# Patient Record
Sex: Male | Born: 1970 | Race: Black or African American | Hispanic: No | Marital: Married | Smoking: Never smoker
Health system: Southern US, Community
[De-identification: ages and names within clinical notes are randomized; demographics above are authoritative.]

## PROBLEM LIST (undated history)

## (undated) DIAGNOSIS — K5792 Diverticulitis of intestine, part unspecified, without perforation or abscess without bleeding: Secondary | ICD-10-CM

## (undated) DIAGNOSIS — I10 Essential (primary) hypertension: Secondary | ICD-10-CM

## (undated) DIAGNOSIS — K859 Acute pancreatitis without necrosis or infection, unspecified: Secondary | ICD-10-CM

## (undated) HISTORY — PX: OTHER SURGICAL HISTORY: SHX169

## (undated) HISTORY — PX: CHOLECYSTECTOMY: SHX55

---

## 2003-08-18 ENCOUNTER — Ambulatory Visit (HOSPITAL_COMMUNITY): Admission: RE | Admit: 2003-08-18 | Discharge: 2003-08-18 | Payer: Self-pay | Admitting: Gastroenterology

## 2003-12-19 ENCOUNTER — Ambulatory Visit (HOSPITAL_COMMUNITY): Admission: RE | Admit: 2003-12-19 | Discharge: 2003-12-19 | Payer: Self-pay | Admitting: Family Medicine

## 2010-04-24 ENCOUNTER — Emergency Department (HOSPITAL_BASED_OUTPATIENT_CLINIC_OR_DEPARTMENT_OTHER): Admission: EM | Admit: 2010-04-24 | Discharge: 2010-04-24 | Payer: Self-pay | Admitting: Emergency Medicine

## 2010-04-25 ENCOUNTER — Inpatient Hospital Stay (HOSPITAL_COMMUNITY): Admission: EM | Admit: 2010-04-25 | Discharge: 2010-04-30 | Payer: Self-pay | Admitting: Emergency Medicine

## 2010-04-28 ENCOUNTER — Encounter (INDEPENDENT_AMBULATORY_CARE_PROVIDER_SITE_OTHER): Payer: Self-pay | Admitting: Internal Medicine

## 2010-05-22 ENCOUNTER — Encounter: Admission: RE | Admit: 2010-05-22 | Discharge: 2010-05-22 | Payer: Self-pay | Admitting: Gastroenterology

## 2011-03-18 LAB — LIPASE, BLOOD
Lipase: 31 U/L (ref 11–59)
Lipase: 32 U/L (ref 11–59)
Lipase: 178 U/L — ABNORMAL HIGH (ref 11–59)
Lipase: 53 U/L (ref 11–59)
Lipase: >10000 U/L — ABNORMAL HIGH (ref 23–300)

## 2011-03-18 LAB — COMPREHENSIVE METABOLIC PANEL
ALT: 109 U/L — ABNORMAL HIGH (ref 0–53)
ALT: 125 U/L — ABNORMAL HIGH (ref 0–53)
ALT: 126 U/L — ABNORMAL HIGH (ref 0–53)
ALT: 183 U/L — ABNORMAL HIGH (ref 0–53)
ALT: 528 U/L — ABNORMAL HIGH (ref 0–53)
ALT: 663 U/L — ABNORMAL HIGH (ref 0–53)
AST: 118 U/L — ABNORMAL HIGH (ref 0–37)
AST: 35 U/L (ref 0–37)
AST: 56 U/L — ABNORMAL HIGH (ref 0–37)
AST: 216 U/L — ABNORMAL HIGH (ref 0–37)
AST: 41 U/L — ABNORMAL HIGH (ref 0–37)
AST: 674 U/L — ABNORMAL HIGH (ref 0–37)
Albumin: 3.1 g/dL — ABNORMAL LOW (ref 3.5–5.2)
Albumin: 3.2 g/dL — ABNORMAL LOW (ref 3.5–5.2)
Albumin: 3.5 g/dL (ref 3.5–5.2)
Albumin: 4.2 g/dL (ref 3.5–5.2)
Albumin: 4.2 g/dL (ref 3.5–5.2)
Alkaline Phosphatase: 107 U/L (ref 39–117)
Alkaline Phosphatase: 52 U/L (ref 39–117)
Alkaline Phosphatase: 64 U/L (ref 39–117)
Alkaline Phosphatase: 100 U/L (ref 39–117)
Alkaline Phosphatase: 74 U/L (ref 39–117)
Alkaline Phosphatase: 99 U/L (ref 39–117)
BUN: 4 mg/dL — ABNORMAL LOW (ref 6–23)
BUN: 6 mg/dL (ref 6–23)
BUN: 7 mg/dL (ref 6–23)
BUN: 10 mg/dL (ref 6–23)
BUN: 10 mg/dL (ref 6–23)
BUN: 14 mg/dL (ref 6–23)
CO2: 24 mEq/L (ref 19–32)
CO2: 26 mEq/L (ref 19–32)
CO2: 28 mEq/L (ref 19–32)
CO2: 29 mEq/L (ref 19–32)
CO2: 29 mEq/L (ref 19–32)
CO2: 29 mEq/L (ref 19–32)
CO2: 30 mEq/L (ref 19–32)
Calcium: 8.6 mg/dL (ref 8.4–10.5)
Calcium: 8.7 mg/dL (ref 8.4–10.5)
Calcium: 8.8 mg/dL (ref 8.4–10.5)
Calcium: 8.5 mg/dL (ref 8.4–10.5)
Calcium: 9.2 mg/dL (ref 8.4–10.5)
Calcium: 9.6 mg/dL (ref 8.4–10.5)
Chloride: 103 mEq/L (ref 96–112)
Chloride: 103 mEq/L (ref 96–112)
Chloride: 104 mEq/L (ref 96–112)
Chloride: 102 mEq/L (ref 96–112)
Chloride: 103 mEq/L (ref 96–112)
Creatinine, Ser: 0.92 mg/dL (ref 0.4–1.5)
Creatinine, Ser: 0.96 mg/dL (ref 0.4–1.5)
Creatinine, Ser: 0.98 mg/dL (ref 0.4–1.5)
Creatinine, Ser: 1.14 mg/dL (ref 0.4–1.5)
Creatinine, Ser: 1.2 mg/dL (ref 0.4–1.5)
Creatinine, Ser: 1.25 mg/dL (ref 0.4–1.5)
GFR calc Af Amer: >60 mL/min (ref >60)
GFR calc Af Amer: >60 mL/min (ref >60)
GFR calc Af Amer: >60 mL/min (ref >60)
GFR calc Af Amer: >60 mL/min (ref >60)
GFR calc Af Amer: >60 mL/min (ref >60)
GFR calc Af Amer: >60 mL/min (ref >60)
GFR calc non Af Amer: >60 mL/min (ref >60)
GFR calc non Af Amer: >60 mL/min (ref >60)
GFR calc non Af Amer: >60 mL/min (ref >60)
GFR calc non Af Amer: >60 mL/min (ref >60)
GFR calc non Af Amer: >60 mL/min (ref >60)
GFR calc non Af Amer: >60 mL/min (ref >60)
Glucose, Bld: 83 mg/dL (ref 70–99)
Glucose, Bld: 94 mg/dL (ref 70–99)
Glucose, Bld: 96 mg/dL (ref 70–99)
Glucose, Bld: 141 mg/dL — ABNORMAL HIGH (ref 70–99)
Glucose, Bld: 146 mg/dL — ABNORMAL HIGH (ref 70–99)
Glucose, Bld: 83 mg/dL (ref 70–99)
Glucose, Bld: 90 mg/dL (ref 70–99)
Potassium: 3.4 mEq/L — ABNORMAL LOW (ref 3.5–5.1)
Potassium: 3.7 mEq/L (ref 3.5–5.1)
Potassium: 3.8 mEq/L (ref 3.5–5.1)
Potassium: 3.5 mEq/L (ref 3.5–5.1)
Potassium: 3.5 mEq/L (ref 3.5–5.1)
Potassium: 3.6 mEq/L (ref 3.5–5.1)
Sodium: 136 mEq/L (ref 135–145)
Sodium: 137 mEq/L (ref 135–145)
Sodium: 139 mEq/L (ref 135–145)
Sodium: 139 mEq/L (ref 135–145)
Sodium: 140 mEq/L (ref 135–145)
Sodium: 144 mEq/L (ref 135–145)
Total Bilirubin: 0.9 mg/dL (ref 0.3–1.2)
Total Bilirubin: 1.1 mg/dL (ref 0.3–1.2)
Total Bilirubin: 1.4 mg/dL — ABNORMAL HIGH (ref 0.3–1.2)
Total Bilirubin: 1 mg/dL (ref 0.3–1.2)
Total Bilirubin: 1 mg/dL (ref 0.3–1.2)
Total Bilirubin: 1.7 mg/dL — ABNORMAL HIGH (ref 0.3–1.2)
Total Protein: 6 g/dL (ref 6.0–8.3)
Total Protein: 6.5 g/dL (ref 6.0–8.3)
Total Protein: 6.5 g/dL (ref 6.0–8.3)
Total Protein: 6.3 g/dL (ref 6.0–8.3)
Total Protein: 7 g/dL (ref 6.0–8.3)
Total Protein: 7.5 g/dL (ref 6.0–8.3)

## 2011-03-18 LAB — DIFFERENTIAL
Basophils Absolute: 0 10*3/uL (ref 0.0–0.1)
Basophils Absolute: 0 10*3/uL (ref 0.0–0.1)
Basophils Relative: 0 % (ref 0–1)
Basophils Relative: 0 % (ref 0–1)
Eosinophils Absolute: 0 10*3/uL (ref 0.0–0.7)
Eosinophils Absolute: 0 10*3/uL (ref 0.0–0.7)
Eosinophils Relative: 0 % (ref 0–5)
Eosinophils Relative: 0 % (ref 0–5)
Lymphocytes Relative: 10 % — ABNORMAL LOW (ref 12–46)
Lymphocytes Relative: 4 % — ABNORMAL LOW (ref 12–46)
Lymphs Abs: 0.7 10*3/uL (ref 0.7–4.0)
Lymphs Abs: 1.8 10*3/uL (ref 0.7–4.0)
Monocytes Absolute: 0.8 10*3/uL (ref 0.1–1.0)
Monocytes Absolute: 0.9 10*3/uL (ref 0.1–1.0)
Monocytes Relative: 4 % (ref 3–12)
Monocytes Relative: 5 % (ref 3–12)
Neutro Abs: 15.2 10*3/uL — ABNORMAL HIGH (ref 1.7–7.7)
Neutro Abs: 15.9 10*3/uL — ABNORMAL HIGH (ref 1.7–7.7)
Neutrophils Relative %: 85 % — ABNORMAL HIGH (ref 43–77)
Neutrophils Relative %: 91 % — ABNORMAL HIGH (ref 43–77)
Smear Review: NORMAL

## 2011-03-18 LAB — CBC
HCT: 37.4 % — ABNORMAL LOW (ref 39.0–52.0)
HCT: 38.7 % — ABNORMAL LOW (ref 39.0–52.0)
HCT: 38.7 % — ABNORMAL LOW (ref 39.0–52.0)
HCT: 41.7 % (ref 39.0–52.0)
HCT: 46.2 % (ref 39.0–52.0)
HCT: 48.2 % (ref 39.0–52.0)
Hemoglobin: 13 g/dL (ref 13.0–17.0)
Hemoglobin: 13.2 g/dL (ref 13.0–17.0)
Hemoglobin: 13.4 g/dL (ref 13.0–17.0)
Hemoglobin: 13.7 g/dL (ref 13.0–17.0)
Hemoglobin: 14.5 g/dL (ref 13.0–17.0)
Hemoglobin: 15.7 g/dL (ref 13.0–17.0)
Hemoglobin: 16.6 g/dL (ref 13.0–17.0)
MCHC: 34.2 g/dL (ref 30.0–36.0)
MCHC: 34.6 g/dL (ref 30.0–36.0)
MCHC: 34.8 g/dL (ref 30.0–36.0)
MCHC: 34.1 g/dL (ref 30.0–36.0)
MCHC: 34.5 g/dL (ref 30.0–36.0)
MCHC: 34.6 g/dL (ref 30.0–36.0)
MCV: 89.4 fL (ref 78.0–100.0)
MCV: 89.5 fL (ref 78.0–100.0)
MCV: 89.9 fL (ref 78.0–100.0)
MCV: 88 fL (ref 78.0–100.0)
MCV: 89.1 fL (ref 78.0–100.0)
MCV: 89.6 fL (ref 78.0–100.0)
Platelets: 144 10*3/uL — ABNORMAL LOW (ref 150–400)
Platelets: 152 10*3/uL (ref 150–400)
Platelets: 176 10*3/uL (ref 150–400)
Platelets: 164 10*3/uL (ref 150–400)
Platelets: 184 10*3/uL (ref 150–400)
RBC: 4.18 MIL/uL — ABNORMAL LOW (ref 4.22–5.81)
RBC: 4.31 MIL/uL (ref 4.22–5.81)
RBC: 4.32 MIL/uL (ref 4.22–5.81)
RBC: 4.44 MIL/uL (ref 4.22–5.81)
RBC: 4.66 MIL/uL (ref 4.22–5.81)
RBC: 5.18 MIL/uL (ref 4.22–5.81)
RBC: 5.47 MIL/uL (ref 4.22–5.81)
RDW: 13.5 % (ref 11.5–15.5)
RDW: 13.7 % (ref 11.5–15.5)
RDW: 13.7 % (ref 11.5–15.5)
RDW: 13.1 % (ref 11.5–15.5)
RDW: 13.9 % (ref 11.5–15.5)
WBC: 11.7 10*3/uL — ABNORMAL HIGH (ref 4.0–10.5)
WBC: 12.2 10*3/uL — ABNORMAL HIGH (ref 4.0–10.5)
WBC: 15.1 10*3/uL — ABNORMAL HIGH (ref 4.0–10.5)
WBC: 17.4 10*3/uL — ABNORMAL HIGH (ref 4.0–10.5)
WBC: 17.9 10*3/uL — ABNORMAL HIGH (ref 4.0–10.5)

## 2011-03-18 LAB — CMV ANTIBODY, IGG (EIA): CMV Ab - IgG: <0.2 IU/mL (ref <0.4)

## 2011-03-18 LAB — ACETAMINOPHEN LEVEL: Acetaminophen (Tylenol), Serum: <10 ug/mL — ABNORMAL LOW (ref 10–30)

## 2011-03-18 LAB — URINALYSIS, ROUTINE W REFLEX MICROSCOPIC
Bilirubin Urine: NEGATIVE
Glucose, UA: NEGATIVE mg/dL
Hgb urine dipstick: NEGATIVE
Ketones, ur: NEGATIVE mg/dL
Nitrite: NEGATIVE
Protein, ur: NEGATIVE mg/dL
Specific Gravity, Urine: 1.011 (ref 1.005–1.030)
Urobilinogen, UA: 2 mg/dL — ABNORMAL HIGH (ref 0.0–1.0)
pH: 7.5 (ref 5.0–8.0)

## 2011-03-18 LAB — LIPID PANEL
Cholesterol: 165 mg/dL (ref 0–200)
LDL Cholesterol: 103 mg/dL — ABNORMAL HIGH (ref 0–99)
Total CHOL/HDL Ratio: 3.2 RATIO

## 2011-03-18 LAB — HSV(HERPES SMPLX)ABS-I+II(IGG+IGM)-BLD
Herpes Simplex Vrs I + II Ab, IgG: 0.6 IV
Herpes Simplex Vrs I&II-IgM Ab (EIA): 0.24 INDEX

## 2011-03-18 LAB — ETHANOL
Alcohol, Ethyl (B): <5 mg/dL (ref 0–10)
Alcohol, Ethyl (B): <5 mg/dL (ref 0–10)

## 2011-03-18 LAB — LACTATE DEHYDROGENASE
LDH: 224 U/L (ref 94–250)
LDH: 354 U/L — ABNORMAL HIGH (ref 94–250)

## 2011-03-18 LAB — HEPATITIS PANEL, ACUTE: Hep B C IgM: NEGATIVE

## 2011-03-18 LAB — CMV IGM: CMV IgM: <8 AU/mL (ref <30.0)

## 2011-03-18 LAB — B. BURGDORFI ANTIBODIES: B burgdorferi Ab IgG+IgM: 0.18 {ISR}

## 2011-05-16 NOTE — Op Note (Signed)
   NAME:  Brandon Bryant, Brandon Bryant                        ACCOUNT NO.:  [id number]   MEDICAL RECORD NO.:  [id number]                   PATIENT TYPE:  AMB   LOCATION:  ENDO                                 FACILITY:  [facility]   PHYSICIAN:  Danise Edge, M.D.                DATE OF BIRTH:  24-Dec-1971   DATE OF PROCEDURE:  08/18/2003  DATE OF DISCHARGE:                                 OPERATIVE REPORT   PROCEDURE:  Colonoscopy.   PROCEDURE INDICATION:  [name] is a 40 year old male, undergoing  diagnostic colonoscopy to evaluate intermittent painless hematochezia.   ENDOSCOPIST:  Charolett Bumpers, M.D.   PREMEDICATION:  1. Versed 10 mg.  2. Demerol 100 mg.   DESCRIPTION OF PROCEDURE:  After obtaining informed consent, [name] was  placed in the left lateral decubitus position.  I administered intravenous  Demerol and intravenous Versed to achieve conscious sedation for the  procedure.  The patient's blood pressure, oxygen saturation, and cardiac  rhythm were monitored throughout the procedure and documented in the medical  record.   Anal inspection was normal.  Digital rectal examination was normal.  The  Olympus adjustable pediatric colonoscope was introduced into the rectum and  advanced to the cecum.  A normal-appearing ileocecal valve was intubated and  the distal ileum inspected.  Colonic preparation for the exam today was  excellent.   RECTUM:  Normal.  SIGMOID COLON AND DESCENDING COLON:  Normal.  SPLENIC FLEXURE:  Normal.  TRANSVERSE COLON:  Normal.  HEPATIC FLEXURE:  Normal.  ASCENDING COLON:  Normal.  CECUM AND ILEOCECAL VALVE:  Normal.  DISTAL ILEUM:  Normal.   ASSESSMENT:  Normal proctocolonoscopy to the cecum with intubation of a  normal-appearing ileocecal valve and distal ileum inspection.                                               Danise Edge, M.D.    MJ/MEDQ  D:  08/18/2003  T:  08/19/2003  Job:  [id number]

## 2013-11-08 ENCOUNTER — Emergency Department (HOSPITAL_BASED_OUTPATIENT_CLINIC_OR_DEPARTMENT_OTHER)
Admission: EM | Admit: 2013-11-08 | Discharge: 2013-11-08 | Disposition: A | Discharge status: Home / Self Care | Payer: BC Managed Care – PPO | Attending: Emergency Medicine | Admitting: Emergency Medicine

## 2013-11-08 ENCOUNTER — Encounter (HOSPITAL_BASED_OUTPATIENT_CLINIC_OR_DEPARTMENT_OTHER): Payer: Self-pay | Admitting: Emergency Medicine

## 2013-11-08 DIAGNOSIS — I10 Essential (primary) hypertension: Secondary | ICD-10-CM | POA: Insufficient documentation

## 2013-11-08 DIAGNOSIS — Z8719 Personal history of other diseases of the digestive system: Secondary | ICD-10-CM | POA: Insufficient documentation

## 2013-11-08 DIAGNOSIS — L03011 Cellulitis of right finger: Secondary | ICD-10-CM

## 2013-11-08 DIAGNOSIS — L03019 Cellulitis of unspecified finger: Secondary | ICD-10-CM | POA: Insufficient documentation

## 2013-11-08 DIAGNOSIS — Z79899 Other long term (current) drug therapy: Secondary | ICD-10-CM | POA: Insufficient documentation

## 2013-11-08 HISTORY — DX: Acute pancreatitis without necrosis or infection, unspecified: K85.90

## 2013-11-08 HISTORY — DX: Essential (primary) hypertension: I10

## 2013-11-08 MED ORDER — SULFAMETHOXAZOLE-TRIMETHOPRIM 800-160 MG PO TABS: 1.0000 | ORAL_TABLET | Freq: Two times a day (BID) | ORAL | Status: AC

## 2013-11-08 NOTE — ED Notes (Signed)
PA at bedside.

## 2013-11-08 NOTE — ED Provider Notes (Signed)
CSN: [id number]     Arrival date & time 11/08/13  1853 History   First MD Initiated Contact with Patient 11/08/13 2022     Chief Complaint  Patient presents with  . Abscess   (Consider location/radiation/quality/duration/timing/severity/associated sxs/prior Treatment) Patient is a 42 y.o. male presenting with abscess. The history is provided by the patient. No language interpreter was used.  Abscess Location:  Finger Abscess quality: draining   Red streaking: no   Progression:  Worsening Chronicity:  New Context: not diabetes   Relieved by:  Nothing Worsened by:  Nothing tried Ineffective treatments:  None tried Pt complains of swelling around nail  Past Medical History  Diagnosis Date  . Hypertension   . Pancreatitis    Past Surgical History  Procedure Laterality Date  . Cholecystectomy    . Kneee surgery     No family history on file. History  Substance Use Topics  . Smoking status: Never Smoker   . Smokeless tobacco: Not on file  . Alcohol Use: No    Review of Systems  Skin: Positive for wound.  All other systems reviewed and are negative.    Allergies  Review of patient's allergies indicates no known allergies.  Home Medications   Current Outpatient Rx  Name  Route  Sig  Dispense  Refill  . hydrochlorothiazide (HYDRODIURIL) 25 MG tablet   Oral   Take 25 mg by mouth daily.         Marland Kitchen sulfamethoxazole-trimethoprim (BACTRIM DS,SEPTRA DS) 800-160 MG per tablet   Oral   Take 1 tablet by mouth 2 (two) times daily.   14 tablet   0    BP 154/93  Pulse 88  Temp(Src) 98.6 F (37 C) (Oral)  Resp 18  Ht 6' (1.829 m)  Wt 335 lb (151.955 kg)  BMI 45.42 kg/m2  SpO2 100% Physical Exam  Nursing note and vitals reviewed. Constitutional: He is oriented to person, place, and time. He appears well-developed and well-nourished.  HENT:  Head: Normocephalic.  Musculoskeletal: He exhibits tenderness.  Swollen green fluctuant area around nailbed   Neurological: He is alert and oriented to person, place, and time. He has normal reflexes.  Skin: Skin is warm.  Psychiatric: He has a normal mood and affect.    ED Course  INCISION AND DRAINAGE Date/Time: 11/08/2013 8:47 PM Performed by: Elson Areas Authorized by: Elson Areas Consent given by: patient Patient understanding: patient states understanding of the procedure being performed Patient identity confirmed: verbally with patient Time out: Immediately prior to procedure a "time out" was called to verify the correct patient, procedure, equipment, support staff and site/side marked as required. Type: abscess Body area: upper extremity Location details: right ring finger Anesthesia: local infiltration Scalpel size: 11 Incision type: single straight Complexity: simple Drainage amount: moderate Wound treatment: wound left open Patient tolerance: Patient tolerated the procedure well with no immediate complications.   (including critical care time) Labs Review Labs Reviewed - No data to display Imaging Review No results found.  EKG Interpretation   None       MDM   1. Paronychia, finger, right    Soak area and bactrim   Elson Areas, PA-C 11/08/13 2048

## 2013-11-08 NOTE — ED Provider Notes (Signed)
Medical screening examination/treatment/procedure(s) were performed by non-physician practitioner and as supervising physician I was immediately available for consultation/collaboration.    Celene Kras, MD 11/08/13 [phone]

## 2013-11-08 NOTE — ED Notes (Signed)
Pt c/o abscess around the nail bed of his right 4th finger x3-4 days.

## 2014-01-22 ENCOUNTER — Emergency Department (HOSPITAL_BASED_OUTPATIENT_CLINIC_OR_DEPARTMENT_OTHER)
Admission: EM | Admit: 2014-01-22 | Discharge: 2014-01-22 | Disposition: A | Discharge status: Home / Self Care | Payer: BC Managed Care – PPO | Attending: Emergency Medicine | Admitting: Emergency Medicine

## 2014-01-22 ENCOUNTER — Encounter (HOSPITAL_BASED_OUTPATIENT_CLINIC_OR_DEPARTMENT_OTHER): Payer: Self-pay | Admitting: Emergency Medicine

## 2014-01-22 DIAGNOSIS — K5792 Diverticulitis of intestine, part unspecified, without perforation or abscess without bleeding: Secondary | ICD-10-CM

## 2014-01-22 DIAGNOSIS — K5732 Diverticulitis of large intestine without perforation or abscess without bleeding: Secondary | ICD-10-CM | POA: Insufficient documentation

## 2014-01-22 DIAGNOSIS — Z79899 Other long term (current) drug therapy: Secondary | ICD-10-CM | POA: Insufficient documentation

## 2014-01-22 DIAGNOSIS — I10 Essential (primary) hypertension: Secondary | ICD-10-CM | POA: Insufficient documentation

## 2014-01-22 MED ORDER — METRONIDAZOLE 500 MG PO TABS
500.0000 mg | ORAL_TABLET | Freq: Once | ORAL | Status: AC
  Administered 2014-01-22: 500 mg via ORAL
  Filled 2014-01-22: qty 1

## 2014-01-22 MED ORDER — OXYCODONE-ACETAMINOPHEN 5-325 MG PO TABS
2.0000 | ORAL_TABLET | Freq: Once | ORAL | Status: AC
  Administered 2014-01-22: 2 via ORAL
  Filled 2014-01-22: qty 2

## 2014-01-22 MED ORDER — OXYCODONE-ACETAMINOPHEN 5-325 MG PO TABS: 1.0000 | ORAL_TABLET | Freq: Four times a day (QID) | ORAL | Status: DC | PRN

## 2014-01-22 MED ORDER — CIPROFLOXACIN HCL 500 MG PO TABS
500.0000 mg | ORAL_TABLET | Freq: Once | ORAL | Status: AC
  Administered 2014-01-22: 500 mg via ORAL
  Filled 2014-01-22: qty 1

## 2014-01-22 MED ORDER — CIPROFLOXACIN HCL 500 MG PO TABS: 500.0000 mg | ORAL_TABLET | Freq: Two times a day (BID) | ORAL | Status: DC

## 2014-01-22 MED ORDER — METRONIDAZOLE 500 MG PO TABS: 500.0000 mg | ORAL_TABLET | Freq: Three times a day (TID) | ORAL | Status: DC

## 2014-01-22 NOTE — ED Notes (Signed)
Patient having LLQ pain that started Friday. Denies any other symptoms. History of diverticulitis and worried this may be coming back. LBM this AM

## 2014-01-22 NOTE — Discharge Instructions (Signed)
Diverticulitis °A diverticulum is a small pouch or sac on the colon. Diverticulosis is the presence of these diverticula on the colon. Diverticulitis is the irritation (inflammation) or infection of diverticula. °CAUSES  °The colon and its diverticula contain bacteria. If food particles block the tiny opening to a diverticulum, the bacteria inside can grow and cause an increase in pressure. This leads to infection and inflammation and is called diverticulitis. °SYMPTOMS  °· Abdominal pain and tenderness. Usually, the pain is located on the left side of your abdomen. However, it could be located elsewhere. °· Fever. °· Bloating. °· Feeling sick to your stomach (nausea). °· Throwing up (vomiting). °· Abnormal stools. °DIAGNOSIS  °Your caregiver will take a history and perform a physical exam. Since many things can cause abdominal pain, other tests may be necessary. Tests may include: °· Blood tests. °· Urine tests. °· X-ray of the abdomen. °· CT scan of the abdomen. °Sometimes, surgery is needed to determine if diverticulitis or other conditions are causing your symptoms. °TREATMENT  °Most of the time, you can be treated without surgery. Treatment includes: °· Resting the bowels by only having liquids for a few days. As you improve, you will need to eat a low-fiber diet. °· Intravenous (IV) fluids if you are losing body fluids (dehydrated). °· Antibiotic medicines that treat infections may be given. °· Pain and nausea medicine, if needed. °· Surgery if the inflamed diverticulum has burst. °HOME CARE INSTRUCTIONS  °· Try a clear liquid diet (broth, tea, or water for as long as directed by your caregiver). You may then gradually begin a low-fiber diet as tolerated.  °A low-fiber diet is a diet with less than 10 grams of fiber. Choose the foods below to reduce fiber in the diet: °· White breads, cereals, rice, and pasta. °· Cooked fruits and vegetables or soft fresh fruits and vegetables without the skin. °· Ground or  well-cooked tender beef, ham, veal, lamb, pork, or poultry. °· Eggs and seafood. °· After your diverticulitis symptoms have improved, your caregiver may put you on a high-fiber diet. A high-fiber diet includes 14 grams of fiber for every 1000 calories consumed. For a standard 2000 calorie diet, you would need 28 grams of fiber. Follow these diet guidelines to help you increase the fiber in your diet. It is important to slowly increase the amount fiber in your diet to avoid gas, constipation, and bloating. °· Choose whole-grain breads, cereals, pasta, and brown rice. °· Choose fresh fruits and vegetables with the skin on. Do not overcook vegetables because the more vegetables are cooked, the more fiber is lost. °· Choose more nuts, seeds, legumes, dried peas, beans, and lentils. °· Look for food products that have greater than 3 grams of fiber per serving on the Nutrition Facts label. °· Take all medicine as directed by your caregiver. °· If your caregiver has given you a follow-up appointment, it is very important that you go. Not going could result in lasting (chronic) or permanent injury, pain, and disability. If there is any problem keeping the appointment, call to reschedule. °SEEK MEDICAL CARE IF:  °· Your pain does not improve. °· You have a hard time advancing your diet beyond clear liquids. °· Your bowel movements do not return to normal. °SEEK IMMEDIATE MEDICAL CARE IF:  °· Your pain becomes worse. °· You have an oral temperature above 102° F (38.9° C), not controlled by medicine. °· You have repeated vomiting. °· You have bloody or black, tarry stools. °·   Symptoms that brought you to your caregiver become worse or are not getting better. °MAKE SURE YOU:  °· Understand these instructions. °· Will watch your condition. °· Will get help right away if you are not doing well or get worse. °Document Released: 09/24/2005 Document Revised: 03/08/2012 Document Reviewed: 01/20/2011 °Exit[employer]® Patient Information  ©2014 [employer]. ° °

## 2014-01-22 NOTE — ED Provider Notes (Signed)
CSN: [id number]     Arrival date & time 01/22/14  1057 History   First MD Initiated Contact with Patient 01/22/14 1134     Chief Complaint  Patient presents with  . Abdominal Pain   (Consider location/radiation/quality/duration/timing/severity/associated sxs/prior Treatment) Patient is a 43 y.o. male presenting with abdominal pain.  Abdominal Pain  Pt with history of diverticulitis in 2004 and gall-stone pancreatitis in 2011 s/o lap chole reports 2 days of moderate sharp LLQ pain worse with rolling over. Similar to previous diverticulitis, not associated with vomiting, fever or anorexia. No bloody or melanic stools.   Past Medical History  Diagnosis Date  . Hypertension   . Pancreatitis    Past Surgical History  Procedure Laterality Date  . Cholecystectomy    . Kneee surgery     No family history on file. History  Substance Use Topics  . Smoking status: Never Smoker   . Smokeless tobacco: Not on file  . Alcohol Use: No    Review of Systems  Gastrointestinal: Positive for abdominal pain.   All other systems reviewed and are negative except as noted in HPI.   Allergies  Review of patient's allergies indicates no known allergies.  Home Medications   Current Outpatient Rx  Name  Route  Sig  Dispense  Refill  . hydrochlorothiazide (HYDRODIURIL) 25 MG tablet   Oral   Take 25 mg by mouth daily.          BP 130/82  Pulse 78  Temp(Src) 98.3 F (36.8 C) (Oral)  Resp 20  Ht 6' (1.829 m)  Wt 347 lb (157.398 kg)  BMI 47.05 kg/m2  SpO2 98% Physical Exam  Nursing note and vitals reviewed. Constitutional: He is oriented to person, place, and time. He appears well-developed and well-nourished.  HENT:  Head: Normocephalic and atraumatic.  Eyes: EOM are normal. Pupils are equal, round, and reactive to light.  Neck: Normal range of motion. Neck supple.  Cardiovascular: Normal rate, normal heart sounds and intact distal pulses.   Pulmonary/Chest: Effort normal and breath  sounds normal.  Abdominal: Bowel sounds are normal. He exhibits no distension. There is tenderness (LLQ). There is guarding.  Musculoskeletal: Normal range of motion. He exhibits no edema and no tenderness.  Neurological: He is alert and oriented to person, place, and time. He has normal strength. No cranial nerve deficit or sensory deficit.  Skin: Skin is warm and dry. No rash noted.  Psychiatric: He has a normal mood and affect.    ED Course  Procedures (including critical care time) Labs Review Labs Reviewed - No data to display Imaging Review No results found.  EKG Interpretation   None       MDM   1. Diverticulitis     Pt with history and exam consistent with diverticulitis. Given history of same, lack of constitutional symptoms, minimal pain at this time, will treat empirically and advised close PCP followup for recheck. Advised to return here sooner if pain worsens, fever, vomiting or for any other concerns.     Niaya Hickok B. Bernette MayersSheldon, MD 01/22/14 1150

## 2014-06-25 ENCOUNTER — Encounter (HOSPITAL_BASED_OUTPATIENT_CLINIC_OR_DEPARTMENT_OTHER): Payer: Self-pay | Admitting: Emergency Medicine

## 2014-06-25 ENCOUNTER — Emergency Department (HOSPITAL_BASED_OUTPATIENT_CLINIC_OR_DEPARTMENT_OTHER)
Admission: EM | Admit: 2014-06-25 | Discharge: 2014-06-25 | Disposition: A | Discharge status: Home / Self Care | Payer: BC Managed Care – PPO | Attending: Emergency Medicine | Admitting: Emergency Medicine

## 2014-06-25 ENCOUNTER — Emergency Department (HOSPITAL_BASED_OUTPATIENT_CLINIC_OR_DEPARTMENT_OTHER): Payer: BC Managed Care – PPO

## 2014-06-25 DIAGNOSIS — K5732 Diverticulitis of large intestine without perforation or abscess without bleeding: Secondary | ICD-10-CM | POA: Insufficient documentation

## 2014-06-25 DIAGNOSIS — Z79899 Other long term (current) drug therapy: Secondary | ICD-10-CM | POA: Insufficient documentation

## 2014-06-25 DIAGNOSIS — I10 Essential (primary) hypertension: Secondary | ICD-10-CM | POA: Insufficient documentation

## 2014-06-25 HISTORY — DX: Diverticulitis of intestine, part unspecified, without perforation or abscess without bleeding: K57.92

## 2014-06-25 LAB — URINALYSIS, ROUTINE W REFLEX MICROSCOPIC
GLUCOSE, UA: NEGATIVE mg/dL
Hgb urine dipstick: NEGATIVE
Ketones, ur: >80 mg/dL — AB
LEUKOCYTES UA: NEGATIVE
Nitrite: NEGATIVE
PROTEIN: 100 mg/dL — AB
Specific Gravity, Urine: 1.025 (ref 1.005–1.030)
UROBILINOGEN UA: 0.2 mg/dL (ref 0.0–1.0)
pH: 5.5 (ref 5.0–8.0)

## 2014-06-25 LAB — COMPREHENSIVE METABOLIC PANEL
ALT: 32 U/L (ref 0–53)
AST: 22 U/L (ref 0–37)
Albumin: 4.6 g/dL (ref 3.5–5.2)
Alkaline Phosphatase: 79 U/L (ref 39–117)
BUN: 7 mg/dL (ref 6–23)
CALCIUM: 10.3 mg/dL (ref 8.4–10.5)
CO2: 22 meq/L (ref 19–32)
CREATININE: 1.2 mg/dL (ref 0.50–1.35)
Chloride: 96 mEq/L (ref 96–112)
GFR, EST AFRICAN AMERICAN: 84 mL/min — AB (ref >90)
GFR, EST NON AFRICAN AMERICAN: 73 mL/min — AB (ref >90)
GLUCOSE: 90 mg/dL (ref 70–99)
Potassium: 4.6 mEq/L (ref 3.7–5.3)
Sodium: 139 mEq/L (ref 137–147)
Total Bilirubin: 0.9 mg/dL (ref 0.3–1.2)
Total Protein: 8.3 g/dL (ref 6.0–8.3)

## 2014-06-25 LAB — CBC WITH DIFFERENTIAL/PLATELET
Basophils Absolute: 0.1 10*3/uL (ref 0.0–0.1)
Basophils Relative: 0 % (ref 0–1)
EOS ABS: 0 10*3/uL (ref 0.0–0.7)
EOS PCT: 0 % (ref 0–5)
HEMATOCRIT: 49.5 % (ref 39.0–52.0)
Hemoglobin: 16.8 g/dL (ref 13.0–17.0)
LYMPHS ABS: 1.2 10*3/uL (ref 0.7–4.0)
Lymphocytes Relative: 7 % — ABNORMAL LOW (ref 12–46)
MCH: 28.7 pg (ref 26.0–34.0)
MCHC: 33.9 g/dL (ref 30.0–36.0)
MCV: 84.5 fL (ref 78.0–100.0)
MONO ABS: 0.9 10*3/uL (ref 0.1–1.0)
Monocytes Relative: 5 % (ref 3–12)
Neutro Abs: 15.6 10*3/uL — ABNORMAL HIGH (ref 1.7–7.7)
Neutrophils Relative %: 88 % — ABNORMAL HIGH (ref 43–77)
PLATELETS: 180 10*3/uL (ref 150–400)
RBC: 5.86 MIL/uL — AB (ref 4.22–5.81)
RDW: 14 % (ref 11.5–15.5)
WBC: 17.7 10*3/uL — ABNORMAL HIGH (ref 4.0–10.5)

## 2014-06-25 LAB — URINE MICROSCOPIC-ADD ON

## 2014-06-25 LAB — LIPASE, BLOOD: Lipase: 24 U/L (ref 11–59)

## 2014-06-25 MED ORDER — METRONIDAZOLE IN NACL 5-0.79 MG/ML-% IV SOLN
500.0000 mg | Freq: Once | INTRAVENOUS | Status: AC
  Administered 2014-06-25: 500 mg via INTRAVENOUS
  Filled 2014-06-25: qty 100

## 2014-06-25 MED ORDER — CIPROFLOXACIN IN D5W 400 MG/200ML IV SOLN
400.0000 mg | Freq: Once | INTRAVENOUS | Status: AC
  Administered 2014-06-25: 400 mg via INTRAVENOUS
  Filled 2014-06-25: qty 200

## 2014-06-25 MED ORDER — IOHEXOL 300 MG/ML  SOLN
100.0000 mL | Freq: Once | INTRAMUSCULAR | Status: AC | PRN
  Administered 2014-06-25: 100 mL via INTRAVENOUS

## 2014-06-25 MED ORDER — IOHEXOL 300 MG/ML  SOLN
50.0000 mL | Freq: Once | INTRAMUSCULAR | Status: AC | PRN
  Administered 2014-06-25: 50 mL via ORAL

## 2014-06-25 MED ORDER — SODIUM CHLORIDE 0.9 % IV BOLUS (SEPSIS)
1000.0000 mL | Freq: Once | INTRAVENOUS | Status: AC
  Administered 2014-06-25: 1000 mL via INTRAVENOUS

## 2014-06-25 MED ORDER — OXYCODONE-ACETAMINOPHEN 5-325 MG PO TABS: 1.0000 | ORAL_TABLET | ORAL | Status: AC | PRN

## 2014-06-25 MED ORDER — HYDROMORPHONE HCL PF 1 MG/ML IJ SOLN
1.0000 mg | Freq: Once | INTRAMUSCULAR | Status: AC
  Administered 2014-06-25: 1 mg via INTRAVENOUS
  Filled 2014-06-25: qty 1

## 2014-06-25 MED ORDER — ONDANSETRON 8 MG PO TBDP: 8.0000 mg | ORAL_TABLET | Freq: Three times a day (TID) | ORAL | Status: AC | PRN

## 2014-06-25 MED ORDER — ONDANSETRON HCL 4 MG/2ML IJ SOLN
4.0000 mg | Freq: Once | INTRAMUSCULAR | Status: AC
  Administered 2014-06-25: 4 mg via INTRAVENOUS
  Filled 2014-06-25: qty 2

## 2014-06-25 MED ORDER — METRONIDAZOLE 500 MG PO TABS: 500.0000 mg | ORAL_TABLET | Freq: Two times a day (BID) | ORAL | Status: AC

## 2014-06-25 MED ORDER — CIPROFLOXACIN HCL 500 MG PO TABS: 500.0000 mg | ORAL_TABLET | Freq: Two times a day (BID) | ORAL | Status: AC

## 2014-06-25 NOTE — ED Notes (Addendum)
Patient here with lower abdominal pain since Friday that has become constant since am. Reports vomiting x 1 and no diarrhea. History of diverticulitis and thinks may be flare up, slightly pale on arrival. Started a body cleanse 16 days ago, unsure if related

## 2014-06-25 NOTE — ED Notes (Signed)
Patient transported to CT 

## 2014-06-25 NOTE — ED Provider Notes (Signed)
CSN: [id number]     Arrival date & time 06/25/14  1306 History   First MD Initiated Contact with Patient 06/25/14 1339     Chief Complaint  Patient presents with  . Abdominal Pain     (Consider location/radiation/quality/duration/timing/severity/associated sxs/prior Treatment) Patient is a 43 y.o. male presenting with abdominal pain. The history is provided by the patient.  Abdominal Pain Pain location:  RLQ and LLQ Pain quality: fullness   Pain radiates to:  LLQ and RLQ Pain severity:  Moderate Onset quality:  Gradual Duration:  2 days Timing:  Constant Progression:  Worsening Chronicity:  New Context: not alcohol use, not awakening from sleep, not diet changes, not eating, not laxative use, not medication withdrawal, not previous surgeries, not recent illness, not recent sexual activity, not recent travel, not retching, not sick contacts, not suspicious food intake and not trauma   Relieved by:  Nothing Worsened by:  Nothing tried Associated symptoms: vomiting   Risk factors: no alcohol abuse and no aspirin use     Past Medical History  Diagnosis Date  . Hypertension   . Pancreatitis   . Diverticulitis    Past Surgical History  Procedure Laterality Date  . Cholecystectomy    . Kneee surgery     No family history on file. History  Substance Use Topics  . Smoking status: Never Smoker   . Smokeless tobacco: Not on file  . Alcohol Use: No    Review of Systems  Gastrointestinal: Positive for vomiting and abdominal pain.  All other systems reviewed and are negative.     Allergies  Review of patient's allergies indicates no known allergies.  Home Medications   Prior to Admission medications   Medication Sig Start Date End Date Taking? Authorizing Provider  hydrochlorothiazide (HYDRODIURIL) 25 MG tablet Take 25 mg by mouth daily.   Yes Historical Provider, MD   BP 135/88  Pulse 97  Temp(Src) 97.9 F (36.6 C) (Oral)  Resp 20  Wt 305 lb (138.347 kg)  SpO2  98% Physical Exam  Nursing note and vitals reviewed. Constitutional: He is oriented to person, place, and time. He appears well-developed and well-nourished.  HENT:  Head: Normocephalic and atraumatic.  Eyes: Conjunctivae and EOM are normal. Pupils are equal, round, and reactive to light.  Neck: Normal range of motion. Neck supple.  Cardiovascular: Normal rate, regular rhythm, normal heart sounds and intact distal pulses.   Pulmonary/Chest: Effort normal and breath sounds normal.  Abdominal: Soft. Bowel sounds are normal. He exhibits no mass. There is tenderness. There is rebound. There is no guarding.    Musculoskeletal: Normal range of motion.  Neurological: He is alert and oriented to person, place, and time. He has normal reflexes.  Skin: Skin is warm and dry.  Psychiatric: He has a normal mood and affect. His behavior is normal. Judgment and thought content normal.    ED Course  Procedures (including critical care time) Labs Review Labs Reviewed  URINALYSIS, ROUTINE W REFLEX MICROSCOPIC - Abnormal; Notable for the following:    Color, Urine AMBER (*)    Bilirubin Urine SMALL (*)    Ketones, ur >80 (*)    Protein, ur 100 (*)    All other components within normal limits  COMPREHENSIVE METABOLIC PANEL - Abnormal; Notable for the following:    GFR calc non Af Amer 73 (*)    GFR calc Af Amer 84 (*)    All other components within normal limits  CBC WITH DIFFERENTIAL - Abnormal;  Notable for the following:    WBC 17.7 (*)    RBC 5.86 (*)    Neutrophils Relative % 88 (*)    Neutro Abs 15.6 (*)    Lymphocytes Relative 7 (*)    All other components within normal limits  URINE MICROSCOPIC-ADD ON  LIPASE, BLOOD    Imaging Review No results found.   EKG Interpretation None      MDM   Final diagnoses:  Diverticulitis of large intestine without perforation or abscess without bleeding     Patient with good pain control  Cipro and flagyl given here. Plan op treatment  with strict return precautions and close follow up with Dr. Manus GunningEhinger.  Patient voices understanding.   Hilario Quarryanielle S Ray, MD 06/26/14 [phone]

## 2014-06-25 NOTE — Discharge Instructions (Signed)
Return if you are unable to keep down medicines or liquids, start having a fever, or worsening pain. Recheck with Dr. Manus GunningEhinger this week.  The ct scan results suggest that you should follow up with a gastroenterologist for colonoscopy after this episode resolves.  Diverticulitis Diverticulitis is inflammation or infection of small pouches in your colon that form when you have a condition called diverticulosis. The pouches in your colon are called diverticula. Your colon, or large intestine, is where water is absorbed and stool is formed. Complications of diverticulitis can include:  Bleeding.  Severe infection.  Severe pain.  Perforation of your colon.  Obstruction of your colon. CAUSES  Diverticulitis is caused by bacteria. Diverticulitis happens when stool becomes trapped in diverticula. This allows bacteria to grow in the diverticula, which can lead to inflammation and infection. RISK FACTORS People with diverticulosis are at risk for diverticulitis. Eating a diet that does not include enough fiber from fruits and vegetables may make diverticulitis more likely to develop. SYMPTOMS  Symptoms of diverticulitis may include:  Abdominal pain and tenderness. The pain is normally located on the left side of the abdomen, but may occur in other areas.  Fever and chills.  Bloating.  Cramping.  Nausea.  Vomiting.  Constipation.  Diarrhea.  Blood in your stool. DIAGNOSIS  Your health care provider will ask you about your medical history and do a physical exam. You may need to have tests done because many medical conditions can cause the same symptoms as diverticulitis. Tests may include:  Blood tests.  Urine tests.  Imaging tests of the abdomen, including X-rays and CT scans. When your condition is under control, your health care provider may recommend that you have a colonoscopy. A colonoscopy can show how severe your diverticula are and whether something else is causing your  symptoms. TREATMENT  Most cases of diverticulitis are mild and can be treated at home. Treatment may include:  Taking over-the-counter pain medicines.  Following a clear liquid diet.  Taking antibiotic medicines by mouth for 7-10 days. More severe cases may be treated at a hospital. Treatment may include:  Not eating or drinking.  Taking prescription pain medicine.  Receiving antibiotic medicines through an IV tube.  Receiving fluids and nutrition through an IV tube.  Surgery. HOME CARE INSTRUCTIONS   Follow your health care provider's instructions carefully.  Follow a full liquid diet or other diet as directed by your health care provider. After your symptoms improve, your health care provider may tell you to change your diet. He or she may recommend you eat a high-fiber diet. Fruits and vegetables are good sources of fiber. Fiber makes it easier to pass stool.  Take fiber supplements or probiotics as directed by your health care provider.  Only take medicines as directed by your health care provider.  Keep all your follow-up appointments. SEEK MEDICAL CARE IF:   Your pain does not improve.  You have a hard time eating food.  Your bowel movements do not return to normal. SEEK IMMEDIATE MEDICAL CARE IF:   Your pain becomes worse.  Your symptoms do not get better.  Your symptoms suddenly get worse.  You have a fever.  You have repeated vomiting.  You have bloody or black, tarry stools. MAKE SURE YOU:   Understand these instructions.  Will watch your condition.  Will get help right away if you are not doing well or get worse. Document Released: 09/24/2005 Document Revised: 12/20/2013 Document Reviewed: 11/09/2013 Exit[employer] Patient Information 2015  [employer]. This information is not intended to replace advice given to you by your health care provider. Make sure you discuss any questions you have with your health care provider.

## 2015-09-07 IMAGING — CT CT ABD-PELV W/ CM
2 of 5 series · 16 of 46 positions shown, 18 images · IV contrast (omnipaque)
Comparison: CT 12/19/2003; MRI abdomen 05/22/2010.

CLINICAL DATA: Bilateral lower abdominal pain.

EXAM:
CT ABDOMEN AND PELVIS WITH CONTRAST
TECHNIQUE: Multidetector CT imaging of the abdomen and pelvis was performed
using the standard protocol following bolus administration of
intravenous contrast.
CONTRAST:  100mL OMNIPAQUE IOHEXOL 300 MG/ML SOLN, 50mL OMNIPAQUE
IOHEXOL 300 MG/ML SOLN

[Series 4: abd/pelvis 5.0 b31f · axial · 0.86mm/px · z∈[-503,-43]mm · 13 of 104 slices shown, 15 images]
[im 6/104  soft-tissue]
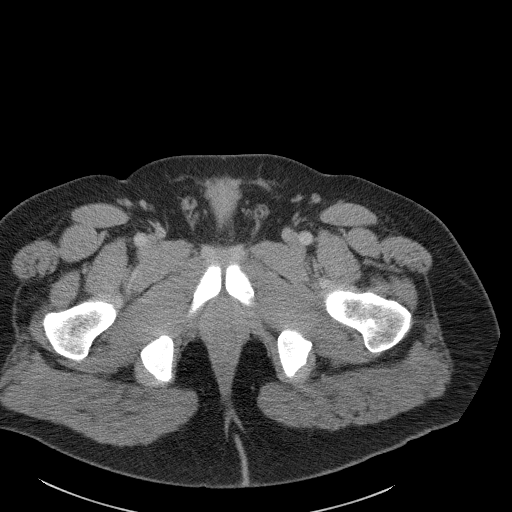
[im 6/104  bone]
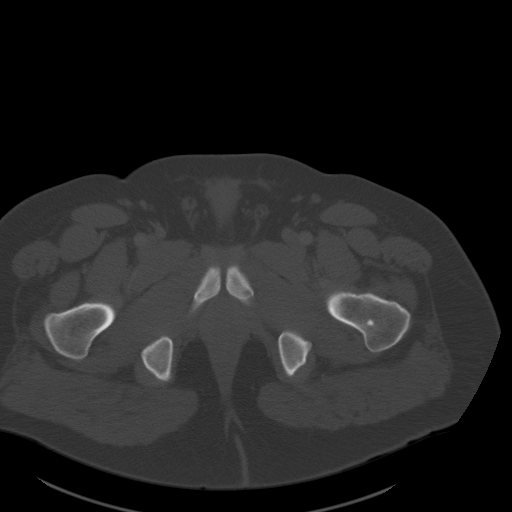
[im 16/104  soft-tissue]
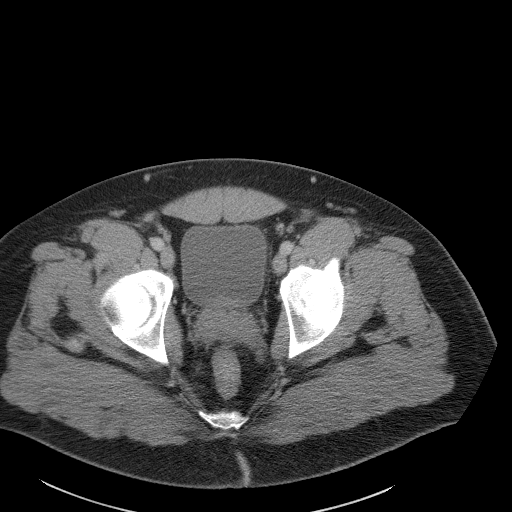
[im 21/104  soft-tissue]
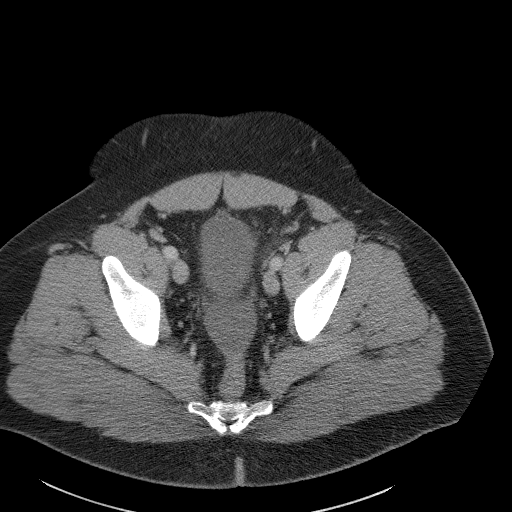
[im 31/104  soft-tissue]
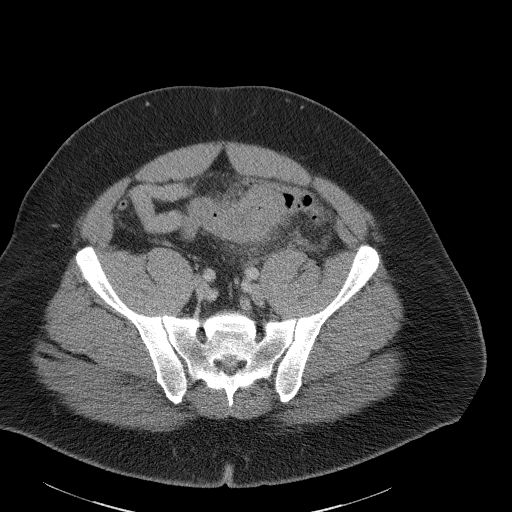
[im 37/104  soft-tissue]
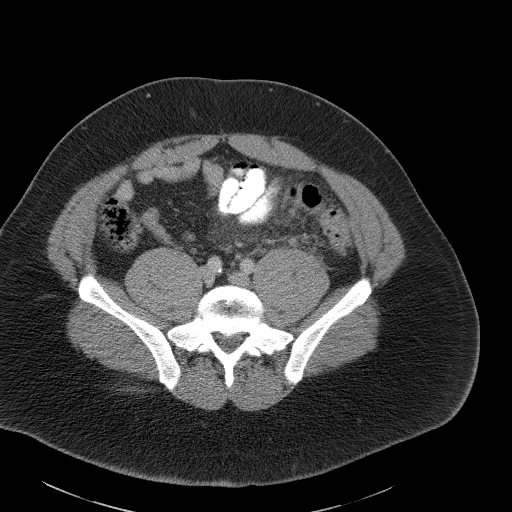
[im 47/104  soft-tissue]
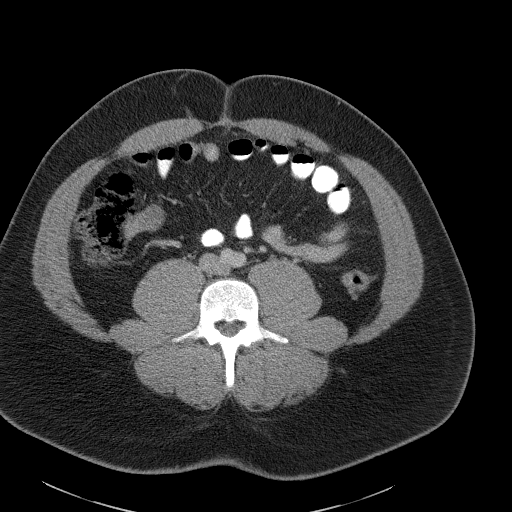
[im 52/104  soft-tissue]
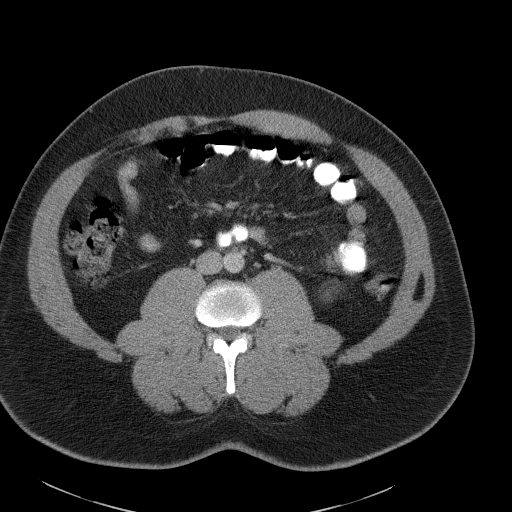
[im 57/104  soft-tissue]
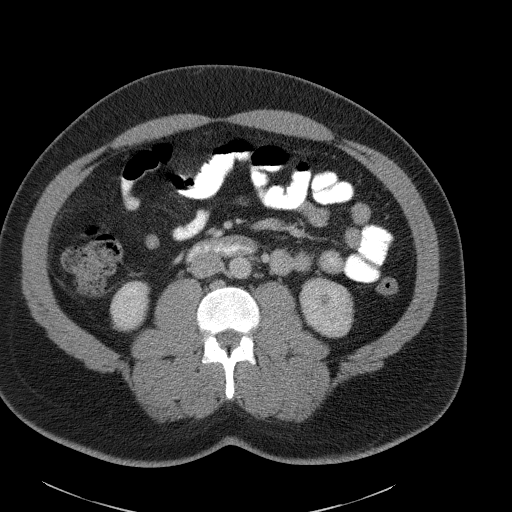
[im 67/104  soft-tissue]
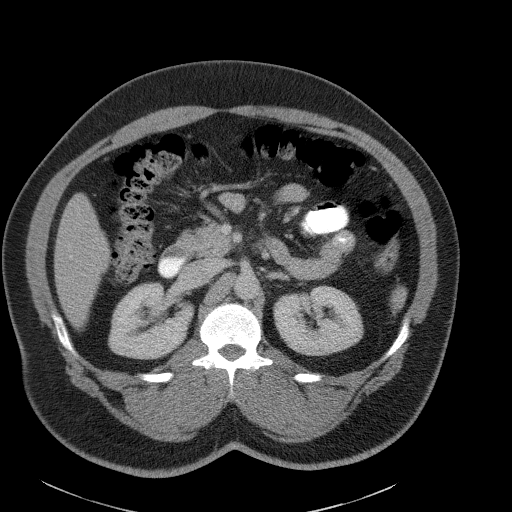
[im 67/104  bone]
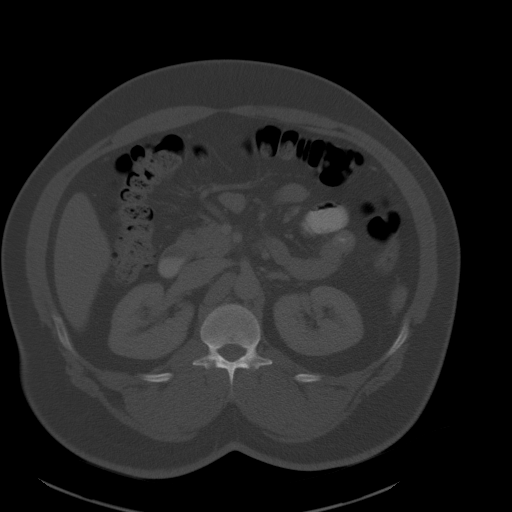
[im 73/104  soft-tissue]
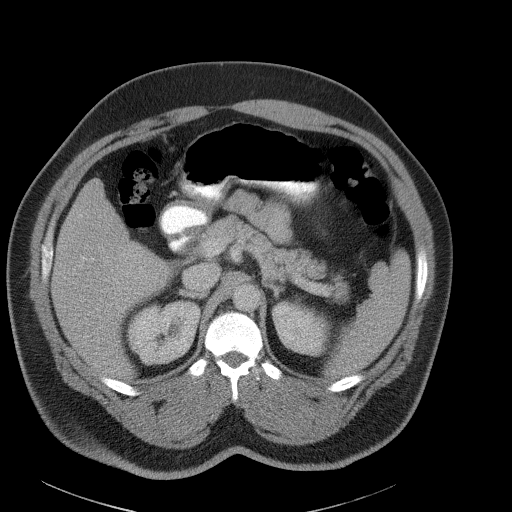
[im 83/104  soft-tissue]
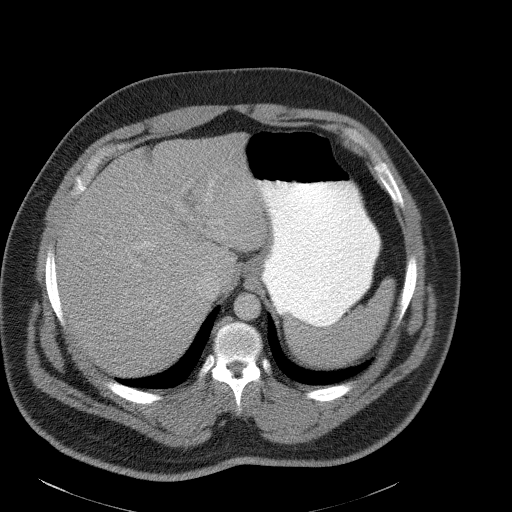
[im 88/104  soft-tissue]
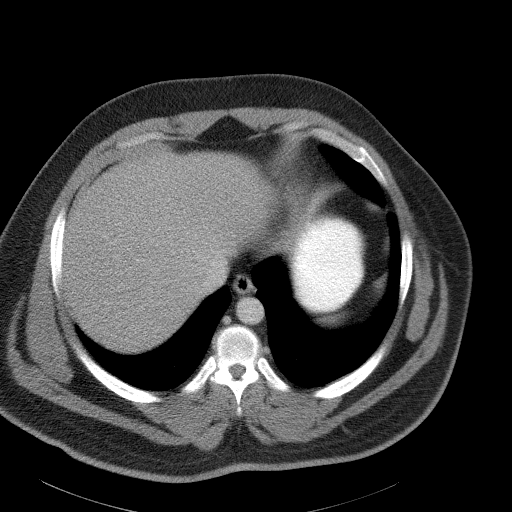
[im 98/104  soft-tissue]
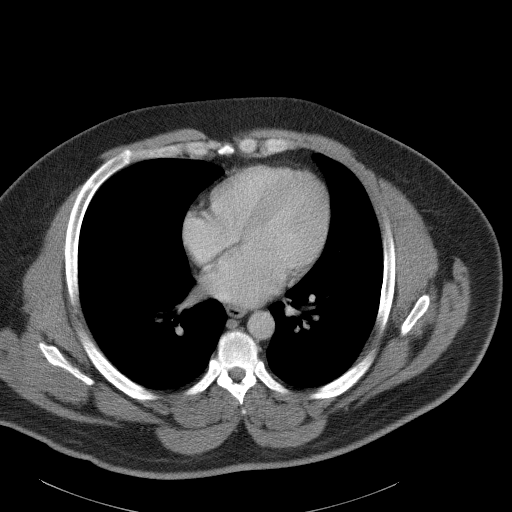

[Series 7: abd/pelvis 3.0 coronal · coronal · 1.00mm/px · 3 of 117 slices shown]
[im 39/117  soft-tissue]
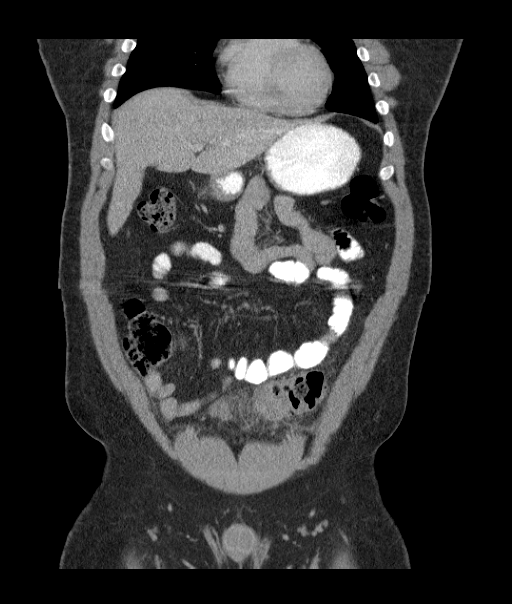
[im 52/117  soft-tissue]
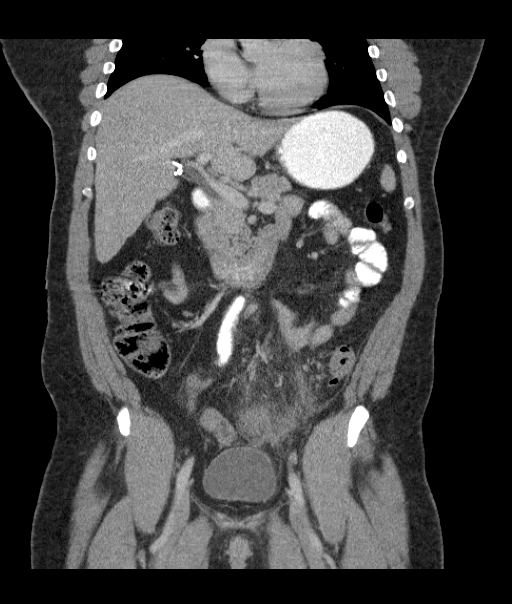
[im 65/117  soft-tissue]
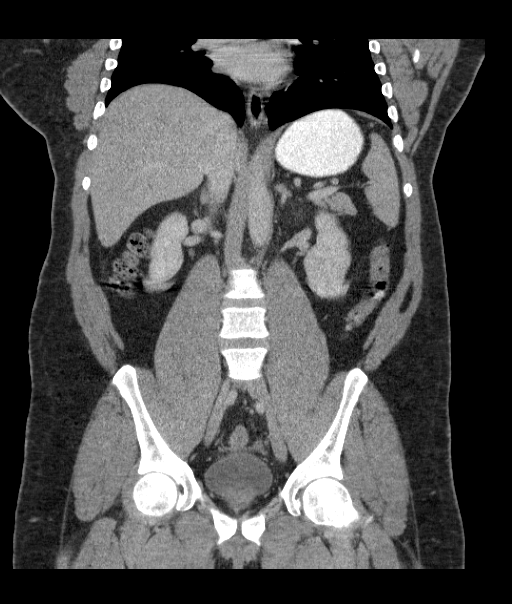

[16 of 46 positions shown; findings below may reference images not displayed]

FINDINGS: Visualization of the lower thorax demonstrates no consolidative
pulmonary opacities. Normal heart size.

Focal fatty deposition adjacent to the falciform ligament. No focal
hepatic lesion identified. Patient status post cholecystectomy. No
intrahepatic or extrahepatic biliary ductal dilatation. Spleen,
pancreas and bilateral adrenal glands are normal. Kidneys enhance
symmetrically with contrast. No hydronephrosis.

No retroperitoneal adenopathy. Urinary bladder is unremarkable.
Prostate unremarkable.

Focal circumferential wall thickening of the sigmoid colon with
pericolonic fat stranding. No evidence for adjacent free
intraperitoneal air or abscess formation. Multiple sub cm
pericolonic lymph nodes are demonstrated extensive descending and
sigmoid colonic diverticulosis. Normal appendix. No evidence for
bowel obstruction.

No aggressive or acute appearing osseous lesions.
IMPRESSION: Findings compatible with acute sigmoid diverticulitis. If not
previously performed, recommend visualization with colonoscopy upon
resolution of the acute symptomatology.

These results were called by telephone at the time of interpretation
on 06/25/2014 at [DATE] to Dr. SANEISHA MARJAY , who verbally
acknowledged these results.

## 2016-12-10 DIAGNOSIS — G4733 Obstructive sleep apnea (adult) (pediatric): Secondary | ICD-10-CM | POA: Diagnosis not present

## 2017-02-19 DIAGNOSIS — M545 Low back pain: Secondary | ICD-10-CM | POA: Diagnosis not present

## 2017-02-19 DIAGNOSIS — M5416 Radiculopathy, lumbar region: Secondary | ICD-10-CM | POA: Diagnosis not present

## 2017-02-26 DIAGNOSIS — G4733 Obstructive sleep apnea (adult) (pediatric): Secondary | ICD-10-CM | POA: Diagnosis not present

## 2017-03-25 DIAGNOSIS — I10 Essential (primary) hypertension: Secondary | ICD-10-CM | POA: Diagnosis not present

## 2017-10-29 DIAGNOSIS — G4733 Obstructive sleep apnea (adult) (pediatric): Secondary | ICD-10-CM | POA: Diagnosis not present

## 2018-02-26 DIAGNOSIS — J019 Acute sinusitis, unspecified: Secondary | ICD-10-CM | POA: Diagnosis not present

## 2018-03-09 DIAGNOSIS — G4733 Obstructive sleep apnea (adult) (pediatric): Secondary | ICD-10-CM | POA: Diagnosis not present

## 2018-03-29 DIAGNOSIS — Z13220 Encounter for screening for lipoid disorders: Secondary | ICD-10-CM | POA: Diagnosis not present

## 2018-03-29 DIAGNOSIS — I10 Essential (primary) hypertension: Secondary | ICD-10-CM | POA: Diagnosis not present

## 2018-05-04 DIAGNOSIS — I10 Essential (primary) hypertension: Secondary | ICD-10-CM | POA: Diagnosis not present

## 2018-11-17 DIAGNOSIS — G4733 Obstructive sleep apnea (adult) (pediatric): Secondary | ICD-10-CM | POA: Diagnosis not present

## 2018-12-10 DIAGNOSIS — G4733 Obstructive sleep apnea (adult) (pediatric): Secondary | ICD-10-CM | POA: Diagnosis not present

## 2019-01-10 DIAGNOSIS — G4733 Obstructive sleep apnea (adult) (pediatric): Secondary | ICD-10-CM | POA: Diagnosis not present

## 2019-02-10 DIAGNOSIS — G4733 Obstructive sleep apnea (adult) (pediatric): Secondary | ICD-10-CM | POA: Diagnosis not present

## 2019-02-17 DIAGNOSIS — G4733 Obstructive sleep apnea (adult) (pediatric): Secondary | ICD-10-CM | POA: Diagnosis not present

## 2019-03-11 DIAGNOSIS — G4733 Obstructive sleep apnea (adult) (pediatric): Secondary | ICD-10-CM | POA: Diagnosis not present

## 2019-04-11 DIAGNOSIS — G4733 Obstructive sleep apnea (adult) (pediatric): Secondary | ICD-10-CM | POA: Diagnosis not present

## 2019-05-11 DIAGNOSIS — G4733 Obstructive sleep apnea (adult) (pediatric): Secondary | ICD-10-CM | POA: Diagnosis not present

## 2019-06-11 DIAGNOSIS — G4733 Obstructive sleep apnea (adult) (pediatric): Secondary | ICD-10-CM | POA: Diagnosis not present

## 2019-07-11 DIAGNOSIS — G4733 Obstructive sleep apnea (adult) (pediatric): Secondary | ICD-10-CM | POA: Diagnosis not present

## 2019-08-11 DIAGNOSIS — G4733 Obstructive sleep apnea (adult) (pediatric): Secondary | ICD-10-CM | POA: Diagnosis not present

## 2019-09-11 DIAGNOSIS — G4733 Obstructive sleep apnea (adult) (pediatric): Secondary | ICD-10-CM | POA: Diagnosis not present

## 2019-10-10 DIAGNOSIS — I10 Essential (primary) hypertension: Secondary | ICD-10-CM | POA: Diagnosis not present

## 2019-10-10 DIAGNOSIS — Z125 Encounter for screening for malignant neoplasm of prostate: Secondary | ICD-10-CM | POA: Diagnosis not present

## 2020-01-13 DIAGNOSIS — R1032 Left lower quadrant pain: Secondary | ICD-10-CM | POA: Diagnosis not present

## 2020-02-20 DIAGNOSIS — G4733 Obstructive sleep apnea (adult) (pediatric): Secondary | ICD-10-CM | POA: Diagnosis not present

## 2020-10-09 DIAGNOSIS — Z23 Encounter for immunization: Secondary | ICD-10-CM | POA: Diagnosis not present

## 2020-10-09 DIAGNOSIS — Z125 Encounter for screening for malignant neoplasm of prostate: Secondary | ICD-10-CM | POA: Diagnosis not present

## 2020-10-09 DIAGNOSIS — I10 Essential (primary) hypertension: Secondary | ICD-10-CM | POA: Diagnosis not present

## 2020-10-09 DIAGNOSIS — Z1211 Encounter for screening for malignant neoplasm of colon: Secondary | ICD-10-CM | POA: Diagnosis not present

## 2020-10-26 DIAGNOSIS — G4733 Obstructive sleep apnea (adult) (pediatric): Secondary | ICD-10-CM | POA: Diagnosis not present

## 2021-02-19 DIAGNOSIS — G4733 Obstructive sleep apnea (adult) (pediatric): Secondary | ICD-10-CM | POA: Diagnosis not present

## 2021-06-13 DIAGNOSIS — K5730 Diverticulosis of large intestine without perforation or abscess without bleeding: Secondary | ICD-10-CM | POA: Diagnosis not present

## 2021-06-13 DIAGNOSIS — K648 Other hemorrhoids: Secondary | ICD-10-CM | POA: Diagnosis not present

## 2021-06-13 DIAGNOSIS — K635 Polyp of colon: Secondary | ICD-10-CM | POA: Diagnosis not present

## 2021-06-13 DIAGNOSIS — K621 Rectal polyp: Secondary | ICD-10-CM | POA: Diagnosis not present

## 2021-06-13 DIAGNOSIS — Z1211 Encounter for screening for malignant neoplasm of colon: Secondary | ICD-10-CM | POA: Diagnosis not present

## 2021-06-26 DIAGNOSIS — S83242D Other tear of medial meniscus, current injury, left knee, subsequent encounter: Secondary | ICD-10-CM | POA: Diagnosis not present

## 2021-08-26 DIAGNOSIS — G4733 Obstructive sleep apnea (adult) (pediatric): Secondary | ICD-10-CM | POA: Diagnosis not present

## 2021-10-09 DIAGNOSIS — Z0000 Encounter for general adult medical examination without abnormal findings: Secondary | ICD-10-CM | POA: Diagnosis not present

## 2021-10-09 DIAGNOSIS — I10 Essential (primary) hypertension: Secondary | ICD-10-CM | POA: Diagnosis not present

## 2021-10-09 DIAGNOSIS — Z23 Encounter for immunization: Secondary | ICD-10-CM | POA: Diagnosis not present

## 2021-10-09 DIAGNOSIS — G4733 Obstructive sleep apnea (adult) (pediatric): Secondary | ICD-10-CM | POA: Diagnosis not present

## 2022-01-08 DIAGNOSIS — Z23 Encounter for immunization: Secondary | ICD-10-CM | POA: Diagnosis not present

## 2022-04-29 DIAGNOSIS — G4733 Obstructive sleep apnea (adult) (pediatric): Secondary | ICD-10-CM | POA: Diagnosis not present

## 2022-05-30 DIAGNOSIS — G4733 Obstructive sleep apnea (adult) (pediatric): Secondary | ICD-10-CM | POA: Diagnosis not present

## 2022-06-29 DIAGNOSIS — G4733 Obstructive sleep apnea (adult) (pediatric): Secondary | ICD-10-CM | POA: Diagnosis not present

## 2022-08-26 DIAGNOSIS — G4733 Obstructive sleep apnea (adult) (pediatric): Secondary | ICD-10-CM | POA: Diagnosis not present

## 2022-09-26 DIAGNOSIS — G4733 Obstructive sleep apnea (adult) (pediatric): Secondary | ICD-10-CM | POA: Diagnosis not present

## 2022-10-10 DIAGNOSIS — I10 Essential (primary) hypertension: Secondary | ICD-10-CM | POA: Diagnosis not present

## 2022-10-10 DIAGNOSIS — Z0000 Encounter for general adult medical examination without abnormal findings: Secondary | ICD-10-CM | POA: Diagnosis not present

## 2022-10-10 DIAGNOSIS — Z23 Encounter for immunization: Secondary | ICD-10-CM | POA: Diagnosis not present

## 2022-10-10 DIAGNOSIS — Z125 Encounter for screening for malignant neoplasm of prostate: Secondary | ICD-10-CM | POA: Diagnosis not present

## 2022-10-26 DIAGNOSIS — G4733 Obstructive sleep apnea (adult) (pediatric): Secondary | ICD-10-CM | POA: Diagnosis not present

## 2022-11-03 DIAGNOSIS — G4733 Obstructive sleep apnea (adult) (pediatric): Secondary | ICD-10-CM | POA: Diagnosis not present

## 2023-02-17 DIAGNOSIS — G4733 Obstructive sleep apnea (adult) (pediatric): Secondary | ICD-10-CM | POA: Diagnosis not present

## 2023-03-23 DIAGNOSIS — G4733 Obstructive sleep apnea (adult) (pediatric): Secondary | ICD-10-CM | POA: Diagnosis not present

## 2023-04-23 DIAGNOSIS — G4733 Obstructive sleep apnea (adult) (pediatric): Secondary | ICD-10-CM | POA: Diagnosis not present

## 2023-05-20 DIAGNOSIS — R351 Nocturia: Secondary | ICD-10-CM | POA: Diagnosis not present

## 2023-05-20 DIAGNOSIS — Z131 Encounter for screening for diabetes mellitus: Secondary | ICD-10-CM | POA: Diagnosis not present

## 2023-05-21 DIAGNOSIS — R351 Nocturia: Secondary | ICD-10-CM | POA: Diagnosis not present

## 2023-05-23 DIAGNOSIS — G4733 Obstructive sleep apnea (adult) (pediatric): Secondary | ICD-10-CM | POA: Diagnosis not present

## 2023-10-13 DIAGNOSIS — I10 Essential (primary) hypertension: Secondary | ICD-10-CM | POA: Diagnosis not present

## 2023-10-13 DIAGNOSIS — Z0000 Encounter for general adult medical examination without abnormal findings: Secondary | ICD-10-CM | POA: Diagnosis not present

## 2023-10-13 DIAGNOSIS — R7303 Prediabetes: Secondary | ICD-10-CM | POA: Diagnosis not present

## 2023-10-13 DIAGNOSIS — G4733 Obstructive sleep apnea (adult) (pediatric): Secondary | ICD-10-CM | POA: Diagnosis not present

## 2023-10-13 DIAGNOSIS — Z125 Encounter for screening for malignant neoplasm of prostate: Secondary | ICD-10-CM | POA: Diagnosis not present

## 2023-10-13 DIAGNOSIS — Z23 Encounter for immunization: Secondary | ICD-10-CM | POA: Diagnosis not present

## 2023-10-13 DIAGNOSIS — Z1159 Encounter for screening for other viral diseases: Secondary | ICD-10-CM | POA: Diagnosis not present

## 2023-11-28 ENCOUNTER — Encounter (HOSPITAL_BASED_OUTPATIENT_CLINIC_OR_DEPARTMENT_OTHER): Payer: Self-pay | Admitting: Emergency Medicine

## 2023-11-28 ENCOUNTER — Emergency Department (HOSPITAL_BASED_OUTPATIENT_CLINIC_OR_DEPARTMENT_OTHER): Payer: BC Managed Care – PPO

## 2023-11-28 ENCOUNTER — Emergency Department (HOSPITAL_BASED_OUTPATIENT_CLINIC_OR_DEPARTMENT_OTHER)
Admission: EM | Admit: 2023-11-28 | Discharge: 2023-11-28 | Disposition: A | Discharge status: Home / Self Care | Payer: BC Managed Care – PPO | Attending: Emergency Medicine | Admitting: Emergency Medicine

## 2023-11-28 ENCOUNTER — Other Ambulatory Visit: Payer: Self-pay

## 2023-11-28 DIAGNOSIS — W268XXA Contact with other sharp object(s), not elsewhere classified, initial encounter: Secondary | ICD-10-CM | POA: Insufficient documentation

## 2023-11-28 DIAGNOSIS — I10 Essential (primary) hypertension: Secondary | ICD-10-CM | POA: Insufficient documentation

## 2023-11-28 DIAGNOSIS — S61211A Laceration without foreign body of left index finger without damage to nail, initial encounter: Secondary | ICD-10-CM | POA: Insufficient documentation

## 2023-11-28 DIAGNOSIS — S6992XA Unspecified injury of left wrist, hand and finger(s), initial encounter: Secondary | ICD-10-CM | POA: Diagnosis not present

## 2023-11-28 NOTE — ED Provider Notes (Signed)
[facility] Emergency Department Provider Note MRN:  [id number]  Arrival date & time: 11/28/23     Chief Complaint   Laceration   History of Present Illness   Brandon Bryant is a 52 y.o. year-old male with a history of hypertension presenting to the ED with chief complaint of laceration.  Patient was taking his new mandolin cutter out of the packaging and accidentally cut his left pointer finger.  No other injuries.  Tetanus shot last year.  Review of Systems  A thorough review of systems was obtained and all systems are negative except as noted in the HPI and PMH.   Patient's Health History    Past Medical History:  Diagnosis Date   Diverticulitis    Hypertension    Pancreatitis     Past Surgical History:  Procedure Laterality Date   CHOLECYSTECTOMY     kneee surgery      History reviewed. No pertinent family history.  Social History   Socioeconomic History   Marital status: Married    Spouse name: Not on file   Number of children: Not on file   Years of education: Not on file   Highest education level: Not on file  Occupational History   Not on file  Tobacco Use   Smoking status: Never   Smokeless tobacco: Never  Substance and Sexual Activity   Alcohol use: No   Drug use: No   Sexual activity: Not on file  Other Topics Concern   Not on file  Social History Narrative   Not on file   Social Determinants of Health   Financial Resource Strain: Not on file  Food Insecurity: Not on file  Transportation Needs: Not on file  Physical Activity: Not on file  Stress: Not on file  Social Connections: Not on file  Intimate Partner Violence: Not on file     Physical Exam   Vitals:   11/28/23 2135  BP: (!) 162/75  Pulse: 87  Resp: 20  Temp: 98 F (36.7 C)  SpO2: 96%    CONSTITUTIONAL: Well-appearing, NAD NEURO/PSYCH:  Alert and oriented x 3, no focal deficits EYES:  eyes equal and reactive ENT/NECK:  no LAD, no  JVD CARDIO: Regular rate, well-perfused, normal S1 and S2 PULM:  CTAB no wheezing or rhonchi GI/GU:  non-distended, non-tender MSK/SPINE:  No gross deformities, no edema SKIN:  no rash, atraumatic   *Additional and/or pertinent findings included in MDM below  Diagnostic and Interventional Summary    EKG Interpretation Date/Time:    Ventricular Rate:    PR Interval:    QRS Duration:    QT Interval:    QTC Calculation:   R Axis:      Text Interpretation:         Labs Reviewed - No data to display  DG Finger Index Left  Final Result      Medications - No data to display   Procedures  /  Critical Care .Laceration Repair  Date/Time: 11/28/2023 11:25 PM  Performed by: Sabas Sous, MD Authorized by: Sabas Sous, MD   Consent:    Consent obtained:  Verbal   Consent given by:  Patient   Risks, benefits, and alternatives were discussed: yes     Risks discussed:  Infection, need for additional repair, poor wound healing, poor cosmetic result and pain   Alternatives discussed: Suture repair. Universal protocol:    Procedure explained and questions answered to patient or proxy's satisfaction:  yes     Immediately prior to procedure, a time out was called: yes     Patient identity confirmed:  Verbally with patient Anesthesia:    Anesthesia method:  None Laceration details:    Location:  Finger   Finger location:  L index finger   Length (cm):  1.5   Depth (mm):  1 Pre-procedure details:    Preparation:  Patient was prepped and draped in usual sterile fashion Exploration:    Limited defect created (wound extended): no     Hemostasis achieved with:  Direct pressure   Imaging obtained: x-ray     Imaging outcome: foreign body not noted     Wound exploration: wound explored through full range of motion and entire depth of wound visualized     Contaminated: no   Treatment:    Area cleansed with:  Saline   Amount of cleaning:  Standard Skin repair:    Repair  method:  Tissue adhesive Approximation:    Approximation:  Close Repair type:    Repair type:  Simple Post-procedure details:    Dressing:  Non-adherent dressing   Procedure completion:  Tolerated well, no immediate complications   ED Course and Medical Decision Making  Initial Impression and Ddx Superficial laceration to the left pointer finger, distal volar surface.  Fairly well-approximated.  Hemostatic.  Good candidate for Dermabond.  Past medical/surgical history that increases complexity of ED encounter: None  Interpretation of Diagnostics I personally reviewed the finger x-ray and my interpretation is as follows: No fracture    Patient Reassessment and Ultimate Disposition/Management     Laceration repair as described above appropriate for discharge.  Patient management required discussion with the following services or consulting groups:  None  Complexity of Problems Addressed Acute complicated illness or Injury  Additional Data Reviewed and Analyzed Further history obtained from: Further history from spouse/family member  Additional Factors Impacting ED Encounter Risk Minor Procedures  Elmer Sow. Pilar Plate, MD [facility] Emergency Medicine [facility] [email or web address]  Final Clinical Impressions(s) / ED Diagnoses     ICD-10-CM   1. Laceration of left index finger without foreign body without damage to nail, initial encounter  Z36.644I       ED Discharge Orders     None        Discharge Instructions Discussed with and Provided to Patient:    Discharge Instructions      You were evaluated in the Emergency Department and after careful evaluation, we did not find any emergent condition requiring admission or further testing in the hospital.  Your exam/testing today was overall reassuring.  We repaired your laceration with medical adhesive.  This will wear away with time.  Try to keep the finger dry for the next few days as we  discussed.  Please return to the Emergency Department if you experience any worsening of your condition.  Thank you for allowing [place] to be a part of your care.       Sabas Sous, MD 11/28/23 [phone]

## 2023-11-28 NOTE — ED Triage Notes (Addendum)
Pt cut 2nd finger on left hand with a mandoline slicer. Tdap Oct. 2022

## 2023-11-28 NOTE — Discharge Instructions (Signed)
You were evaluated in the Emergency Department and after careful evaluation, we did not find any emergent condition requiring admission or further testing in the hospital.  Your exam/testing today was overall reassuring.  We repaired your laceration with medical adhesive.  This will wear away with time.  Try to keep the finger dry for the next few days as we discussed.  Please return to the Emergency Department if you experience any worsening of your condition.  Thank you for allowing [place] to be a part of your care.

## 2024-02-08 DIAGNOSIS — R42 Dizziness and giddiness: Secondary | ICD-10-CM | POA: Diagnosis not present

## 2024-02-27 DIAGNOSIS — G4733 Obstructive sleep apnea (adult) (pediatric): Secondary | ICD-10-CM | POA: Diagnosis not present

## 2024-03-29 DIAGNOSIS — G4733 Obstructive sleep apnea (adult) (pediatric): Secondary | ICD-10-CM | POA: Diagnosis not present

## 2024-04-20 DIAGNOSIS — S83242D Other tear of medial meniscus, current injury, left knee, subsequent encounter: Secondary | ICD-10-CM | POA: Diagnosis not present

## 2024-04-20 DIAGNOSIS — M25561 Pain in right knee: Secondary | ICD-10-CM | POA: Diagnosis not present

## 2024-04-20 DIAGNOSIS — M1712 Unilateral primary osteoarthritis, left knee: Secondary | ICD-10-CM | POA: Diagnosis not present

## 2024-04-28 DIAGNOSIS — G4733 Obstructive sleep apnea (adult) (pediatric): Secondary | ICD-10-CM | POA: Diagnosis not present

## 2024-10-13 DIAGNOSIS — Z125 Encounter for screening for malignant neoplasm of prostate: Secondary | ICD-10-CM | POA: Diagnosis not present

## 2024-10-13 DIAGNOSIS — R7303 Prediabetes: Secondary | ICD-10-CM | POA: Diagnosis not present

## 2024-10-13 DIAGNOSIS — I10 Essential (primary) hypertension: Secondary | ICD-10-CM | POA: Diagnosis not present

## 2024-10-17 DIAGNOSIS — Z23 Encounter for immunization: Secondary | ICD-10-CM | POA: Diagnosis not present

## 2024-10-17 DIAGNOSIS — Z0000 Encounter for general adult medical examination without abnormal findings: Secondary | ICD-10-CM | POA: Diagnosis not present

## 2024-10-17 DIAGNOSIS — I10 Essential (primary) hypertension: Secondary | ICD-10-CM | POA: Diagnosis not present

## 2024-10-17 DIAGNOSIS — R42 Dizziness and giddiness: Secondary | ICD-10-CM | POA: Diagnosis not present

## 2024-10-17 DIAGNOSIS — R7303 Prediabetes: Secondary | ICD-10-CM | POA: Diagnosis not present

## 2024-12-09 DIAGNOSIS — M170 Bilateral primary osteoarthritis of knee: Secondary | ICD-10-CM | POA: Diagnosis not present
